# Patient Record
Sex: Female | Born: 1990 | Race: White | Hispanic: No | Marital: Single | Smoking: Never smoker
Health system: Southern US, Community
[De-identification: ages and names within clinical notes are randomized; demographics above are authoritative.]

## PROBLEM LIST (undated history)

## (undated) ENCOUNTER — Inpatient Hospital Stay (HOSPITAL_COMMUNITY): Payer: Self-pay

## (undated) DIAGNOSIS — F329 Major depressive disorder, single episode, unspecified: Secondary | ICD-10-CM

## (undated) DIAGNOSIS — F4310 Post-traumatic stress disorder, unspecified: Secondary | ICD-10-CM

## (undated) DIAGNOSIS — F32A Depression, unspecified: Secondary | ICD-10-CM

## (undated) DIAGNOSIS — F419 Anxiety disorder, unspecified: Secondary | ICD-10-CM

## (undated) DIAGNOSIS — T8859XA Other complications of anesthesia, initial encounter: Secondary | ICD-10-CM

## (undated) DIAGNOSIS — T4145XA Adverse effect of unspecified anesthetic, initial encounter: Secondary | ICD-10-CM

## (undated) DIAGNOSIS — R569 Unspecified convulsions: Secondary | ICD-10-CM

## (undated) HISTORY — DX: Anxiety disorder, unspecified: F41.9

## (undated) HISTORY — DX: Adverse effect of unspecified anesthetic, initial encounter: T41.45XA

## (undated) HISTORY — DX: Other complications of anesthesia, initial encounter: T88.59XA

## (undated) HISTORY — DX: Depression, unspecified: F32.A

## (undated) HISTORY — PX: TONSILLECTOMY: SUR1361

## (undated) HISTORY — DX: Post-traumatic stress disorder, unspecified: F43.10

## (undated) HISTORY — DX: Major depressive disorder, single episode, unspecified: F32.9

---

## 2011-03-23 ENCOUNTER — Emergency Department: Payer: Self-pay | Admitting: *Deleted

## 2017-08-15 NOTE — L&D Delivery Note (Signed)
Delivery Note Pt complete at 430pm. She pushed for and at 5:00 PM a viable female was delivered via Vaginal, Spontaneous (Presentation: LOA with compound right arm presentation;  ).  APGAR: 8, 9; weight  pending.   Placenta status:delivered intact, schultz , .  Cord: 3vc with the following complications: none .  Cord pH: n/a  Anesthesia: Epidural  Episiotomy: None Lacerations: b/l labial abrasions only Suture Repair: n/a Est. Blood Loss (mL): 100  Mom to postpartum.  Baby to Couplet care / Skin to Skin.  Cathrine Muster 06/06/2018, 5:20 PM

## 2017-10-31 LAB — OB RESULTS CONSOLE HIV ANTIBODY (ROUTINE TESTING): HIV: NONREACTIVE

## 2017-10-31 LAB — OB RESULTS CONSOLE RUBELLA ANTIBODY, IGM: RUBELLA: IMMUNE

## 2017-10-31 LAB — OB RESULTS CONSOLE ABO/RH: RH Type: POSITIVE

## 2017-10-31 LAB — OB RESULTS CONSOLE HEPATITIS B SURFACE ANTIGEN: Hepatitis B Surface Ag: NEGATIVE

## 2017-10-31 LAB — OB RESULTS CONSOLE GC/CHLAMYDIA
Chlamydia: NEGATIVE
GC PROBE AMP, GENITAL: NEGATIVE

## 2017-10-31 LAB — OB RESULTS CONSOLE RPR: RPR: NONREACTIVE

## 2017-10-31 LAB — OB RESULTS CONSOLE ANTIBODY SCREEN: ANTIBODY SCREEN: NEGATIVE

## 2017-11-27 ENCOUNTER — Encounter: Payer: Self-pay | Admitting: Emergency Medicine

## 2017-11-27 ENCOUNTER — Inpatient Hospital Stay (HOSPITAL_COMMUNITY)
Admission: AD | Admit: 2017-11-27 | Discharge: 2017-11-27 | Discharge status: Against Medical Advice | Payer: Medicaid Other | Source: Ambulatory Visit | Attending: Obstetrics and Gynecology | Admitting: Obstetrics and Gynecology

## 2017-11-27 ENCOUNTER — Encounter (HOSPITAL_COMMUNITY): Payer: Self-pay | Admitting: *Deleted

## 2017-11-27 ENCOUNTER — Inpatient Hospital Stay (HOSPITAL_COMMUNITY)
Admission: AD | Admit: 2017-11-27 | Discharge: 2017-11-27 | Discharge status: Absent without leave | Payer: Self-pay | Attending: Obstetrics and Gynecology | Admitting: Obstetrics and Gynecology

## 2017-11-27 ENCOUNTER — Emergency Department: Payer: Medicaid Other

## 2017-11-27 DIAGNOSIS — O208 Other hemorrhage in early pregnancy: Secondary | ICD-10-CM | POA: Insufficient documentation

## 2017-11-27 DIAGNOSIS — R102 Pelvic and perineal pain: Secondary | ICD-10-CM | POA: Diagnosis not present

## 2017-11-27 DIAGNOSIS — Z3A13 13 weeks gestation of pregnancy: Secondary | ICD-10-CM | POA: Diagnosis not present

## 2017-11-27 DIAGNOSIS — Z3481 Encounter for supervision of other normal pregnancy, first trimester: Secondary | ICD-10-CM | POA: Insufficient documentation

## 2017-11-27 DIAGNOSIS — Z3A12 12 weeks gestation of pregnancy: Secondary | ICD-10-CM | POA: Diagnosis not present

## 2017-11-27 DIAGNOSIS — O9989 Other specified diseases and conditions complicating pregnancy, childbirth and the puerperium: Secondary | ICD-10-CM | POA: Diagnosis present

## 2017-11-27 DIAGNOSIS — O2341 Unspecified infection of urinary tract in pregnancy, first trimester: Secondary | ICD-10-CM | POA: Diagnosis not present

## 2017-11-27 LAB — URINALYSIS, ROUTINE W REFLEX MICROSCOPIC
BACTERIA UA: NONE SEEN
Bilirubin Urine: NEGATIVE
Glucose, UA: NEGATIVE mg/dL
HGB URINE DIPSTICK: NEGATIVE
Ketones, ur: NEGATIVE mg/dL
NITRITE: NEGATIVE
PROTEIN: NEGATIVE mg/dL
SPECIFIC GRAVITY, URINE: 1.031 — AB (ref 1.005–1.030)
pH: 5 (ref 5.0–8.0)

## 2017-11-27 LAB — URINALYSIS, COMPLETE (UACMP) WITH MICROSCOPIC
Bilirubin Urine: NEGATIVE
GLUCOSE, UA: NEGATIVE mg/dL
HGB URINE DIPSTICK: NEGATIVE
KETONES UR: NEGATIVE mg/dL
NITRITE: NEGATIVE
PH: 6 (ref 5.0–8.0)
Protein, ur: NEGATIVE mg/dL
Specific Gravity, Urine: 1.03 (ref 1.005–1.030)

## 2017-11-27 LAB — CBC WITH DIFFERENTIAL/PLATELET
Basophils Absolute: 0 10*3/uL (ref 0–0.1)
Basophils Relative: 0 %
EOS ABS: 0.1 10*3/uL (ref 0–0.7)
Eosinophils Relative: 1 %
HCT: 36.8 % (ref 35.0–47.0)
HEMOGLOBIN: 13.1 g/dL (ref 12.0–16.0)
Lymphocytes Relative: 26 %
Lymphs Abs: 3.3 10*3/uL (ref 1.0–3.6)
MCH: 32.2 pg (ref 26.0–34.0)
MCHC: 35.5 g/dL (ref 32.0–36.0)
MCV: 90.6 fL (ref 80.0–100.0)
MONO ABS: 0.9 10*3/uL (ref 0.2–0.9)
MONOS PCT: 7 %
NEUTROS ABS: 8.3 10*3/uL — AB (ref 1.4–6.5)
NEUTROS PCT: 66 %
Platelets: 190 10*3/uL (ref 150–440)
RBC: 4.06 MIL/uL (ref 3.80–5.20)
RDW: 12.7 % (ref 11.5–14.5)
WBC: 12.6 10*3/uL — ABNORMAL HIGH (ref 3.6–11.0)

## 2017-11-27 LAB — BASIC METABOLIC PANEL
Anion gap: 6 (ref 5–15)
BUN: 9 mg/dL (ref 6–20)
CHLORIDE: 106 mmol/L (ref 101–111)
CO2: 25 mmol/L (ref 22–32)
CREATININE: 0.42 mg/dL — AB (ref 0.44–1.00)
Calcium: 8.7 mg/dL — ABNORMAL LOW (ref 8.9–10.3)
GFR calc Af Amer: >60 mL/min (ref >60)
GFR calc non Af Amer: >60 mL/min (ref >60)
Glucose, Bld: 85 mg/dL (ref 65–99)
Potassium: 3.6 mmol/L (ref 3.5–5.1)
Sodium: 137 mmol/L (ref 135–145)

## 2017-11-27 LAB — HCG, QUANTITATIVE, PREGNANCY: hCG, Beta Chain, Quant, S: 96619 m[IU]/mL — ABNORMAL HIGH (ref <5)

## 2017-11-27 NOTE — MAU Note (Addendum)
Pt C/O sudden onset of severe lower & upper abdominal cramping, also back pain - since 1500 today.  Denies bleeding.  Was seen by Dr. Mindi Slicker earlier today, everything was fine, pain started later.

## 2017-11-27 NOTE — ED Triage Notes (Addendum)
Pt reports she is [redacted] weeks pregnant and has been experiencing lower back as well as lower abdomen cramping and pain x1 day. Pt denies bleeding but sts she is experiencing increased "dark" discharge. [facility] and was sent [facility] medically cleared.

## 2017-11-28 ENCOUNTER — Emergency Department
Admission: EM | Admit: 2017-11-28 | Discharge: 2017-11-28 | Disposition: A | Discharge status: Home / Self Care | Payer: Medicaid Other | Attending: Emergency Medicine | Admitting: Emergency Medicine

## 2017-11-28 DIAGNOSIS — O468X1 Other antepartum hemorrhage, first trimester: Secondary | ICD-10-CM

## 2017-11-28 DIAGNOSIS — O418X10 Other specified disorders of amniotic fluid and membranes, first trimester, not applicable or unspecified: Secondary | ICD-10-CM

## 2017-11-28 DIAGNOSIS — N390 Urinary tract infection, site not specified: Secondary | ICD-10-CM

## 2017-11-28 DIAGNOSIS — Z3A12 12 weeks gestation of pregnancy: Secondary | ICD-10-CM

## 2017-11-28 HISTORY — DX: Unspecified convulsions: R56.9

## 2017-11-28 LAB — WET PREP, GENITAL
Clue Cells Wet Prep HPF POC: NONE SEEN
SPERM: NONE SEEN
TRICH WET PREP: NONE SEEN
YEAST WET PREP: NONE SEEN

## 2017-11-28 LAB — CHLAMYDIA/NGC RT PCR (ARMC ONLY)
Chlamydia Tr: NOT DETECTED
N GONORRHOEAE: NOT DETECTED

## 2017-11-28 MED ORDER — NITROFURANTOIN MONOHYD MACRO 100 MG PO CAPS: 100.0000 mg | ORAL_CAPSULE | Freq: Two times a day (BID) | ORAL | 0 refills | Status: DC

## 2017-11-28 MED ORDER — NITROFURANTOIN MONOHYD MACRO 100 MG PO CAPS
100.0000 mg | ORAL_CAPSULE | Freq: Once | ORAL | Status: AC
  Administered 2017-11-28: 100 mg via ORAL
  Filled 2017-11-28: qty 1

## 2017-11-28 NOTE — Discharge Instructions (Signed)
1.  Take antibiotic as prescribed (Macrobid 100 mg twice daily for 7 days). 2.  Drink plenty of fluids daily. 3.  Return to the ER for worsening symptoms, persistent vomiting, vaginal bleeding or other concerns.

## 2017-11-28 NOTE — ED Provider Notes (Signed)
[facility] Emergency Department Provider Note   ____________________________________________   First MD Initiated Contact with Patient 11/28/17 [phone]     (approximate)  I have reviewed the triage vital signs and the nursing notes.   HISTORY  Chief Complaint Abdominal Cramping and Back Pain    HPI Misty Lane is a 27 y.o. female G2P1 Ab0 approximately [redacted] weeks pregnant who presents with pelvic cramping, lower back pain and "dark" discharge.  Symptoms since 3 PM.  Yesterday morning she saw her OB for scheduled appointment for first trimester screening ultrasound.  Denies recent fever, chills, chest pain, shortness of breath, diarrhea.  Has had morning sickness for which she is prescribed likely just.  Last sexual intercourse in February.   Past Medical History:  Diagnosis Date  . Seizures (HCC)     There are no active problems to display for this patient.   History reviewed. No pertinent surgical history.  Prior to Admission medications   Medication Sig Start Date End Date Taking? Authorizing Provider  nitrofurantoin, macrocrystal-monohydrate, (MACROBID) 100 MG capsule Take 1 capsule (100 mg total) by mouth 2 (two) times daily. 11/28/17   Irean Hong, MD    Allergies Patient has no known allergies.  History reviewed. No pertinent family history.  Social History Social History   Tobacco Use  . Smoking status: Never Smoker  . Smokeless tobacco: Never Used  Substance Use Topics  . Alcohol use: Not on file  . Drug use: Not on file    Review of Systems  Constitutional: No fever/chills. Eyes: No visual changes. ENT: No sore throat. Cardiovascular: Denies chest pain. Respiratory: Denies shortness of breath. Gastrointestinal: Positive for pelvic pain.  No abdominal pain.  No nausea, no vomiting.  No diarrhea.  No constipation. Genitourinary: Positive for "dark" discharge.  Negative for dysuria. Musculoskeletal: Negative for back  pain. Skin: Negative for rash. Neurological: Negative for headaches, focal weakness or numbness.   ____________________________________________   PHYSICAL EXAM:  VITAL SIGNS: ED Triage Vitals [11/27/17 2056]  Enc Vitals Group     BP 128/76     Pulse Rate 76     Resp 17     Temp 97.6 F (36.4 C)     Temp Source Oral     SpO2 99 %     Weight 197 lb (89.4 kg)     Height      Head Circumference      Peak Flow      Pain Score      Pain Loc      Pain Edu?      Excl. in GC?     Constitutional: Alert and oriented. Well appearing and in no acute distress. Eyes: Conjunctivae are normal. PERRL. EOMI. Head: Atraumatic. Nose: No congestion/rhinnorhea. Mouth/Throat: Mucous membranes are moist.  Oropharynx non-erythematous. Neck: No stridor.   Cardiovascular: Normal rate, regular rhythm. Grossly normal heart sounds.  Good peripheral circulation. Respiratory: Normal respiratory effort.  No retractions. Lungs CTAB. Gastrointestinal: Soft and nontender to light or deep palpation. No distention. No abdominal bruits. No CVA tenderness. Musculoskeletal: No lower extremity tenderness nor edema.  No joint effusions. Neurologic:  Normal speech and language. No gross focal neurologic deficits are appreciated. No gait instability. Skin:  Skin is warm, dry and intact. No rash noted. Psychiatric: Mood and affect are normal. Speech and behavior are normal.  ____________________________________________   LABS (all labs ordered are listed, but only abnormal results are displayed)  Labs Reviewed  WET PREP, GENITAL -  Abnormal; Notable for the following components:      Result Value   WBC, Wet Prep HPF POC FEW (*)    All other components within normal limits  URINALYSIS, COMPLETE (UACMP) WITH MICROSCOPIC - Abnormal; Notable for the following components:   Color, Urine YELLOW (*)    APPearance CLOUDY (*)    Leukocytes, UA LARGE (*)    Bacteria, UA RARE (*)    Squamous Epithelial / LPF TOO  NUMEROUS TO COUNT (*)    All other components within normal limits  HCG, QUANTITATIVE, PREGNANCY - Abnormal; Notable for the following components:   hCG, Beta Chain, Quant, S [id number] (*)    All other components within normal limits  CBC WITH DIFFERENTIAL/PLATELET - Abnormal; Notable for the following components:   WBC 12.6 (*)    Neutro Abs 8.3 (*)    All other components within normal limits  BASIC METABOLIC PANEL - Abnormal; Notable for the following components:   Creatinine, Ser 0.42 (*)    Calcium 8.7 (*)    All other components within normal limits  CHLAMYDIA/NGC RT PCR ([facility] ONLY)  POC URINE PREG, ED   ____________________________________________  EKG  None ____________________________________________  RADIOLOGY  ED MD interpretation:  [redacted]w[redacted]d IUP; small subchorionic hemorrhage  Official radiology report(s): US Ob Comp Less 14 Wks  Result Date: 11/27/2017 CLINICAL DATA:  Acute onset of pelvic pain and cramping. EXAM: OBSTETRIC <14 WK ULTRASOUND TECHNIQUE: Transabdominal ultrasound was performed for evaluation of the gestation as well as the maternal uterus and adnexal regions. COMPARISON:  None. FINDINGS: Intrauterine gestational sac: Single; visualized and normal in shape. Yolk sac:  No Embryo:  Yes Cardiac Activity: Yes Heart Rate: 158 bpm CRL: 6.4 cm   12 w 5 d                  [place] EDC: 06/06/2018 Subchorionic hemorrhage: A small amount of subchorionic hemorrhage is noted at the right uterine fundus. Maternal uterus/adnexae: The uterus is otherwise unremarkable. The right ovary measures 2.8 x 1.5 x 1.2 cm, while the left ovary measures 3.0 x 2.1 x 2.6 cm. No suspicious adnexal masses are seen; there is no evidence for ovarian torsion. No free fluid is seen within the pelvic cul-de-sac. IMPRESSION: 1. Single live intrauterine pregnancy noted, with a crown-rump length of 6.4 cm, corresponding to a gestational age of [redacted] weeks 5 days. This matches the gestational age of [redacted]  weeks 4 days by LMP, reflecting an estimated date of delivery of June 07, 2018. 2. Small amount of subchorionic hemorrhage noted. Electronically Signed   By: Roanna Raider M.D.   On: 11/27/2017 23:26    ____________________________________________   PROCEDURES  Procedure(s) performed:   Pelvic exam: External exam within normal limits without rashes, lesions or vesicles.  Speculum recent exam reveals mild white discharge.  No vaginal bleeding.  Cervical loss is closed.  Bimanual exam unremarkable.  Procedures  Critical Care performed: No  ____________________________________________   INITIAL IMPRESSION / ASSESSMENT AND PLAN / ED COURSE  As part of my medical decision making, I reviewed the following data within the electronic MEDICAL RECORD NUMBER History obtained from family, Nursing notes reviewed and incorporated, Labs reviewed, Old chart reviewed, Radiograph reviewed  and Notes from prior ED visits   27 year old female G2 P1 approximately [redacted] weeks pregnant who presents with pelvic and lower back pain and "dark" discharge. Differential diagnosis includes, but is not limited to, ovarian cyst, ovarian torsion, acute appendicitis, diverticulitis, urinary tract infection/pyelonephritis, endometriosis,  bowel obstruction, colitis, renal colic, gastroenteritis, hernia, fibroids, endometriosis, pregnancy related pain including ectopic pregnancy, etc.  Large leukocytes on urinalysis, subchorionic hemorrhage found on ultrasound.  Clinical Course as of Nov 29 351  Tue Nov 28, 2017  0344 Updated patient on wet prep results.  She recently had STD check by her OB which was unremarkable.  Will discharge home on Macrobid and she will follow-up closely with her OB/GYN.  Strict return precautions given.  Patient verbalizes understanding and agrees with plan of care.   [JS]    Clinical Course User Index [JS] Irean HongSung, Ahmad Vanwey J, MD     ____________________________________________   FINAL CLINICAL  IMPRESSION(S) / ED DIAGNOSES  Final diagnoses:  Lower urinary tract infectious disease  [redacted] weeks gestation of pregnancy  Subchorionic hemorrhage of placenta in first trimester, single or unspecified fetus     ED Discharge Orders        Ordered    nitrofurantoin, macrocrystal-monohydrate, (MACROBID) 100 MG capsule  2 times daily     11/28/17 0348       Note:  This document was prepared using Dragon voice recognition software and may include unintentional dictation errors.    Irean HongSung, Eldar Robitaille J, MD 11/28/17 [phone]

## 2017-11-28 NOTE — ED Notes (Signed)
Patient discharged to home per MD order. Patient in stable condition, and deemed medically cleared by ED provider for discharge. Discharge instructions reviewed with patient/family using "Teach Back"; verbalized understanding of medication education and administration, and information about follow-up care. Denies further concerns. ° °

## 2018-05-31 LAB — OB RESULTS CONSOLE GBS: GBS: NEGATIVE

## 2018-05-31 IMAGING — US US OB COMP LESS 14 WK
1 series · 13 of 28 positions shown · non-contrast
Comparison: None.

CLINICAL DATA: Acute onset of pelvic pain and cramping.

EXAM:
OBSTETRIC <14 WK ULTRASOUND
TECHNIQUE: Transabdominal ultrasound was performed for evaluation of the
gestation as well as the maternal uterus and adnexal regions.

[Series 1: us ob comp less 14 wk · 0.19mm/px · 53 acquisitions, 13 frames shown]
[im 2/53]
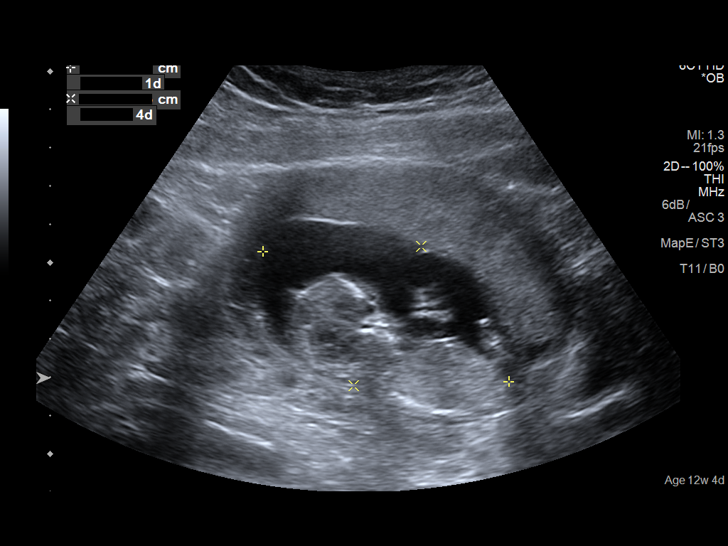
[im 6/53]
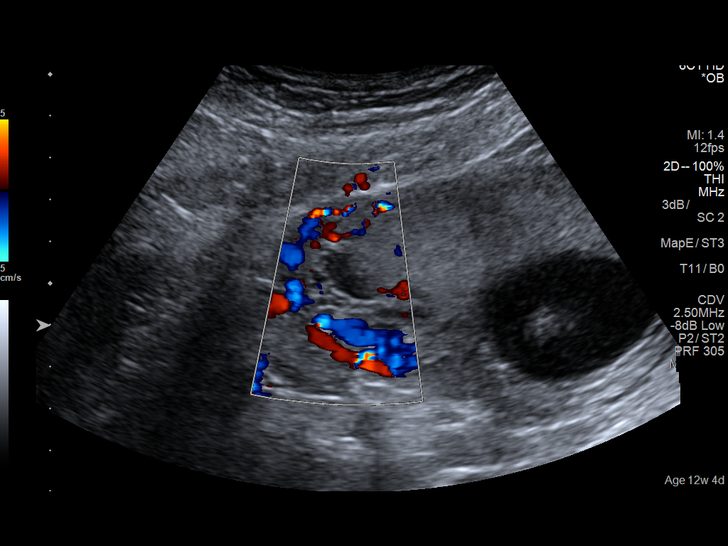
[im 10/53]
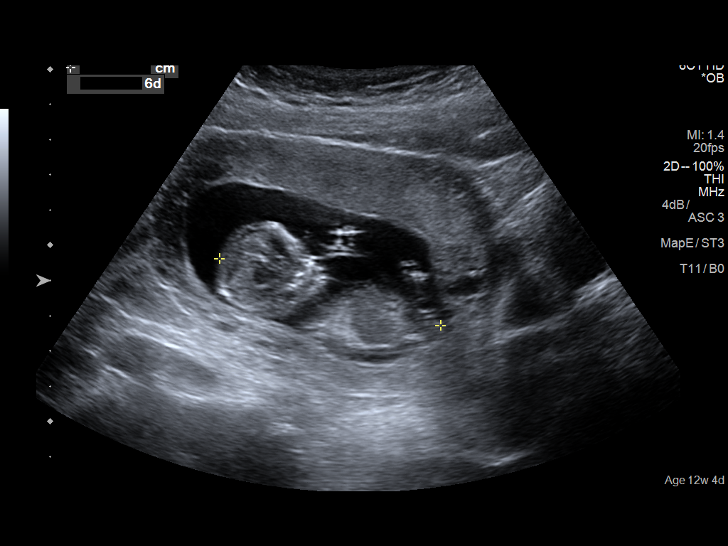
[im 14/53]
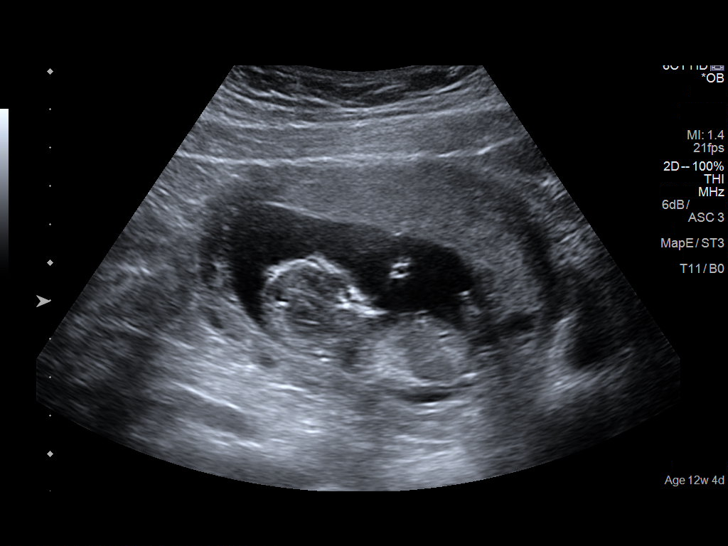
[im 18/53]
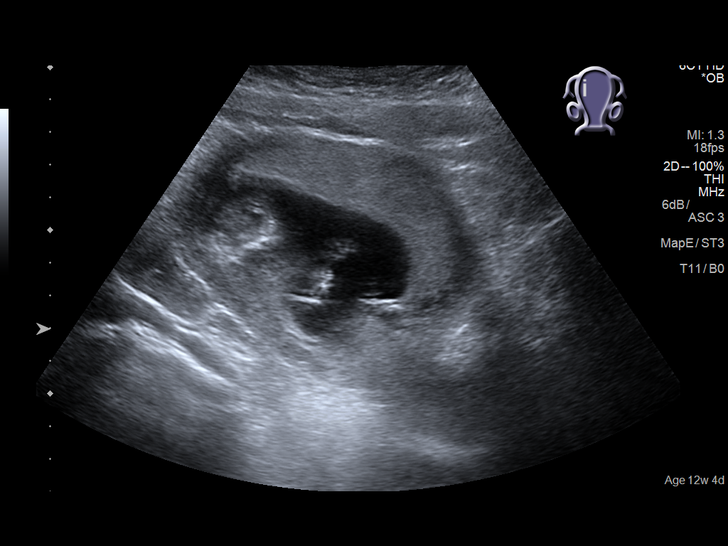
[im 22/53]
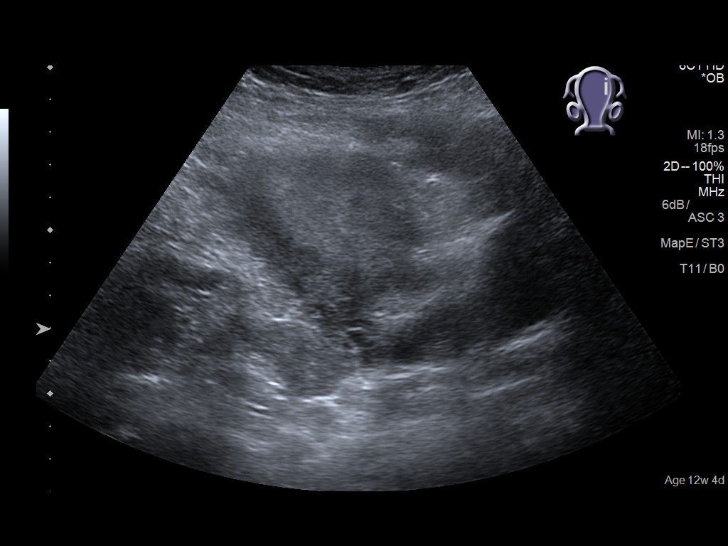
[im 27/53]
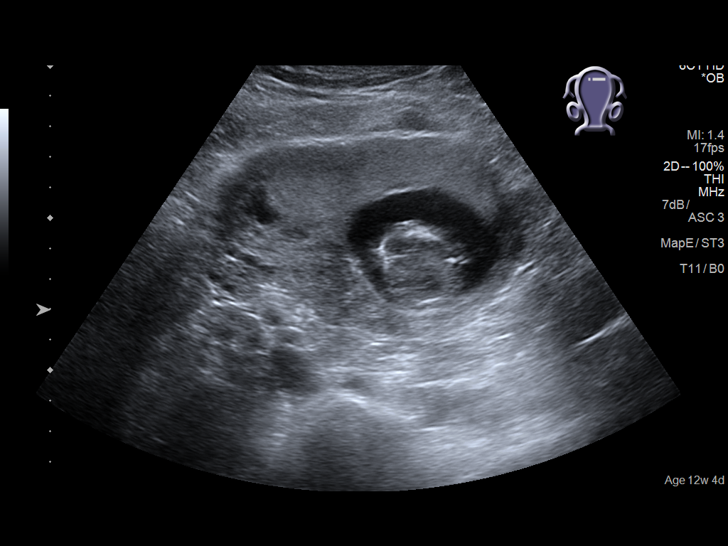
[im 31/53]
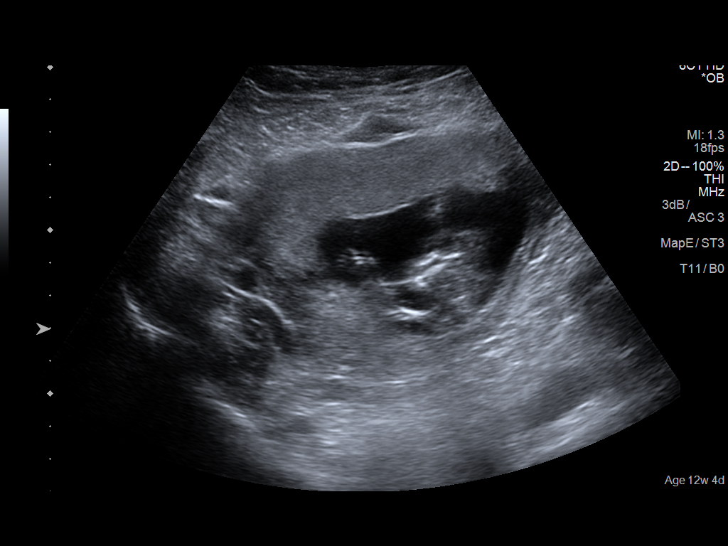
[im 35/53]
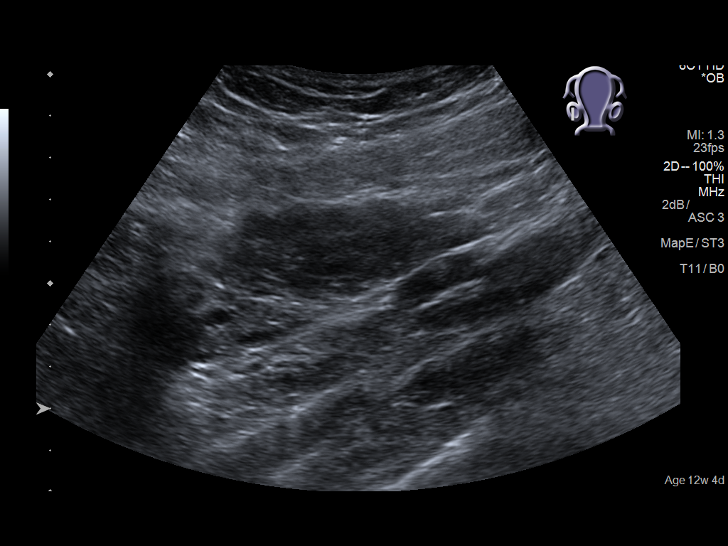
[im 39/53]
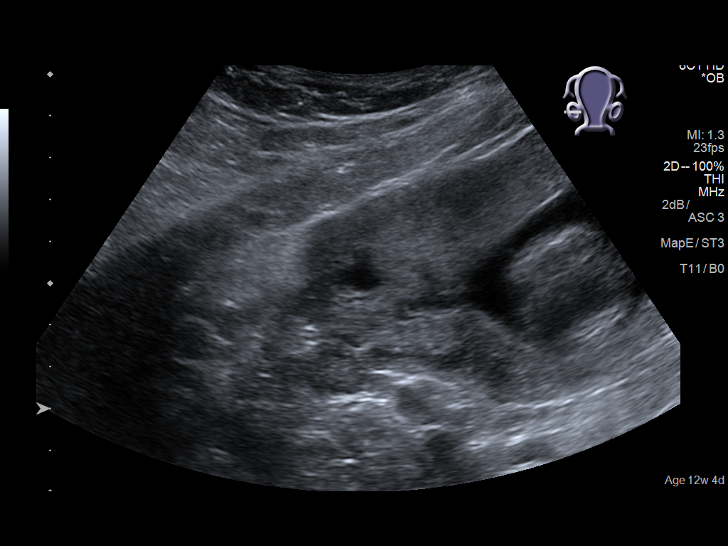
[im 43/53]
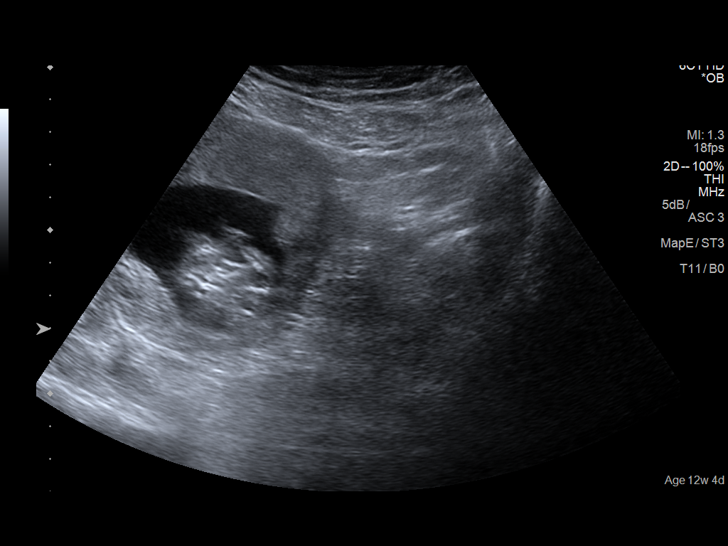
[im 47/53]
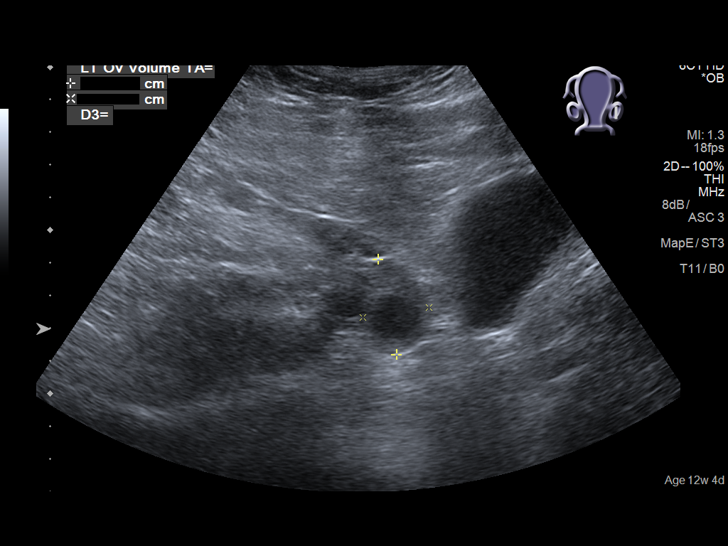
[im 51/53]
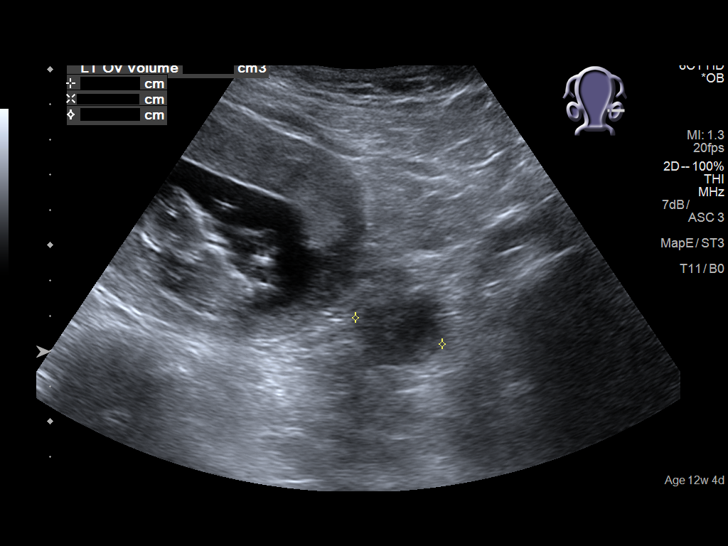

[13 of 28 positions shown; findings below may reference images not displayed]

FINDINGS: Intrauterine gestational sac: Single; visualized and normal in
shape.

Yolk sac:  No

Embryo:  Yes

Cardiac Activity: Yes

Heart Rate: 158 bpm

CRL: 6.4 cm   12 w 5 d                  US EDC: 06/06/2018

Subchorionic hemorrhage: A small amount of subchorionic hemorrhage
is noted at the right uterine fundus.

Maternal uterus/adnexae: The uterus is otherwise unremarkable.

The right ovary measures 2.8 x 1.5 x 1.2 cm, while the left ovary
measures 3.0 x 2.1 x 2.6 cm. No suspicious adnexal masses are seen;
there is no evidence for ovarian torsion.

No free fluid is seen within the pelvic cul-de-sac.
IMPRESSION: 1. Single live intrauterine pregnancy noted, with a crown-rump
length of 6.4 cm, corresponding to a gestational age of 12 weeks 5
days. This matches the gestational age of 12 weeks 4 days by LMP,
reflecting an estimated date of delivery June 07, 2018.
2. Small amount of subchorionic hemorrhage noted.

## 2018-06-01 ENCOUNTER — Encounter (HOSPITAL_COMMUNITY): Payer: Self-pay | Admitting: *Deleted

## 2018-06-01 ENCOUNTER — Telehealth (HOSPITAL_COMMUNITY): Payer: Self-pay | Admitting: *Deleted

## 2018-06-01 NOTE — Telephone Encounter (Signed)
Preadmission screen  

## 2018-06-06 ENCOUNTER — Inpatient Hospital Stay (HOSPITAL_COMMUNITY): Payer: Medicaid Other | Admitting: Anesthesiology

## 2018-06-06 ENCOUNTER — Inpatient Hospital Stay (HOSPITAL_COMMUNITY)
Admission: RE | Admit: 2018-06-06 | Discharge: 2018-06-08 | DRG: 807 | Disposition: A | Discharge status: Home / Self Care | Payer: Medicaid Other | Attending: Obstetrics and Gynecology | Admitting: Obstetrics and Gynecology

## 2018-06-06 ENCOUNTER — Encounter (HOSPITAL_COMMUNITY): Payer: Self-pay

## 2018-06-06 ENCOUNTER — Other Ambulatory Visit: Payer: Self-pay

## 2018-06-06 DIAGNOSIS — O26893 Other specified pregnancy related conditions, third trimester: Secondary | ICD-10-CM | POA: Diagnosis present

## 2018-06-06 DIAGNOSIS — O99344 Other mental disorders complicating childbirth: Secondary | ICD-10-CM | POA: Diagnosis present

## 2018-06-06 DIAGNOSIS — O99354 Diseases of the nervous system complicating childbirth: Secondary | ICD-10-CM | POA: Diagnosis present

## 2018-06-06 DIAGNOSIS — Z3A39 39 weeks gestation of pregnancy: Secondary | ICD-10-CM

## 2018-06-06 DIAGNOSIS — O322XX0 Maternal care for transverse and oblique lie, not applicable or unspecified: Secondary | ICD-10-CM | POA: Diagnosis present

## 2018-06-06 DIAGNOSIS — G40909 Epilepsy, unspecified, not intractable, without status epilepticus: Secondary | ICD-10-CM | POA: Diagnosis present

## 2018-06-06 DIAGNOSIS — F418 Other specified anxiety disorders: Secondary | ICD-10-CM | POA: Diagnosis present

## 2018-06-06 LAB — CBC
HEMATOCRIT: 34.2 % — AB (ref 36.0–46.0)
HEMOGLOBIN: 11.6 g/dL — AB (ref 12.0–15.0)
MCH: 31 pg (ref 26.0–34.0)
MCHC: 33.9 g/dL (ref 30.0–36.0)
MCV: 91.4 fL (ref 80.0–100.0)
Platelets: 174 10*3/uL (ref 150–400)
RBC: 3.74 MIL/uL — ABNORMAL LOW (ref 3.87–5.11)
RDW: 13.7 % (ref 11.5–15.5)
WBC: 12.2 10*3/uL — AB (ref 4.0–10.5)
nRBC: 0 % (ref 0.0–0.2)

## 2018-06-06 LAB — TYPE AND SCREEN
ABO/RH(D): O POS
ANTIBODY SCREEN: NEGATIVE

## 2018-06-06 LAB — RPR: RPR Ser Ql: NONREACTIVE

## 2018-06-06 LAB — ABO/RH: ABO/RH(D): O POS

## 2018-06-06 MED ORDER — TERBUTALINE SULFATE 1 MG/ML IJ SOLN
0.2500 mg | Freq: Once | INTRAMUSCULAR | Status: DC | PRN
  Filled 2018-06-06: qty 1

## 2018-06-06 MED ORDER — SENNOSIDES-DOCUSATE SODIUM 8.6-50 MG PO TABS
2.0000 | ORAL_TABLET | ORAL | Status: DC
  Administered 2018-06-07 – 2018-06-08 (×2): 2 via ORAL
  Filled 2018-06-06 (×2): qty 2

## 2018-06-06 MED ORDER — OXYCODONE-ACETAMINOPHEN 5-325 MG PO TABS: 2.0000 | ORAL_TABLET | ORAL | Status: DC | PRN

## 2018-06-06 MED ORDER — ONDANSETRON HCL 4 MG/2ML IJ SOLN: 4.0000 mg | Freq: Four times a day (QID) | INTRAMUSCULAR | Status: DC | PRN

## 2018-06-06 MED ORDER — LAMOTRIGINE 150 MG PO TABS
300.0000 mg | ORAL_TABLET | Freq: Two times a day (BID) | ORAL | Status: DC
  Administered 2018-06-06 (×2): 300 mg via ORAL
  Filled 2018-06-06 (×3): qty 2

## 2018-06-06 MED ORDER — OXYTOCIN 40 UNITS IN LACTATED RINGERS INFUSION - SIMPLE MED: 2.5000 [IU]/h | INTRAVENOUS | Status: DC

## 2018-06-06 MED ORDER — ACETAMINOPHEN 325 MG PO TABS
650.0000 mg | ORAL_TABLET | ORAL | Status: DC | PRN
  Administered 2018-06-06: 650 mg via ORAL
  Filled 2018-06-06: qty 2

## 2018-06-06 MED ORDER — MISOPROSTOL 25 MCG QUARTER TABLET
25.0000 ug | ORAL_TABLET | ORAL | Status: DC | PRN
  Administered 2018-06-06 (×2): 25 ug via VAGINAL
  Filled 2018-06-06 (×3): qty 1

## 2018-06-06 MED ORDER — OXYTOCIN BOLUS FROM INFUSION
500.0000 mL | Freq: Once | INTRAVENOUS | Status: AC
  Administered 2018-06-06: 500 mL via INTRAVENOUS

## 2018-06-06 MED ORDER — OXYCODONE-ACETAMINOPHEN 5-325 MG PO TABS: 1.0000 | ORAL_TABLET | ORAL | Status: DC | PRN

## 2018-06-06 MED ORDER — PHENYLEPHRINE 40 MCG/ML (10ML) SYRINGE FOR IV PUSH (FOR BLOOD PRESSURE SUPPORT)
80.0000 ug | PREFILLED_SYRINGE | INTRAVENOUS | Status: DC | PRN
  Filled 2018-06-06: qty 5

## 2018-06-06 MED ORDER — PHENYTOIN SODIUM EXTENDED 100 MG PO CAPS
100.0000 mg | ORAL_CAPSULE | Freq: Every day | ORAL | Status: DC
  Administered 2018-06-06: 100 mg via ORAL
  Filled 2018-06-06 (×2): qty 1

## 2018-06-06 MED ORDER — COCONUT OIL OIL: 1.0000 "application " | TOPICAL_OIL | Status: DC | PRN

## 2018-06-06 MED ORDER — EPHEDRINE 5 MG/ML INJ
10.0000 mg | INTRAVENOUS | Status: DC | PRN
  Filled 2018-06-06: qty 2

## 2018-06-06 MED ORDER — OXYCODONE HCL 5 MG PO TABS: 10.0000 mg | ORAL_TABLET | ORAL | Status: DC | PRN

## 2018-06-06 MED ORDER — LIDOCAINE HCL (PF) 1 % IJ SOLN
30.0000 mL | INTRAMUSCULAR | Status: DC | PRN
  Filled 2018-06-06: qty 30

## 2018-06-06 MED ORDER — PHENYLEPHRINE 40 MCG/ML (10ML) SYRINGE FOR IV PUSH (FOR BLOOD PRESSURE SUPPORT)
80.0000 ug | PREFILLED_SYRINGE | INTRAVENOUS | Status: DC | PRN
  Filled 2018-06-06: qty 10
  Filled 2018-06-06: qty 5

## 2018-06-06 MED ORDER — LAMOTRIGINE 150 MG PO TABS
300.0000 mg | ORAL_TABLET | Freq: Two times a day (BID) | ORAL | Status: DC
  Administered 2018-06-07 – 2018-06-08 (×3): 300 mg via ORAL
  Filled 2018-06-06 (×6): qty 2

## 2018-06-06 MED ORDER — DIPHENHYDRAMINE HCL 25 MG PO CAPS: 25.0000 mg | ORAL_CAPSULE | Freq: Four times a day (QID) | ORAL | Status: DC | PRN

## 2018-06-06 MED ORDER — ONDANSETRON HCL 4 MG/2ML IJ SOLN: 4.0000 mg | INTRAMUSCULAR | Status: DC | PRN

## 2018-06-06 MED ORDER — LACTATED RINGERS IV SOLN
500.0000 mL | Freq: Once | INTRAVENOUS | Status: AC
  Administered 2018-06-06: 500 mL via INTRAVENOUS

## 2018-06-06 MED ORDER — PHENYTOIN SODIUM EXTENDED 100 MG PO CAPS
100.0000 mg | ORAL_CAPSULE | Freq: Every day | ORAL | Status: DC
  Administered 2018-06-07 – 2018-06-08 (×2): 100 mg via ORAL
  Filled 2018-06-06 (×3): qty 1

## 2018-06-06 MED ORDER — TETANUS-DIPHTH-ACELL PERTUSSIS 5-2.5-18.5 LF-MCG/0.5 IM SUSP: 0.5000 mL | Freq: Once | INTRAMUSCULAR | Status: DC

## 2018-06-06 MED ORDER — LACTATED RINGERS IV SOLN
INTRAVENOUS | Status: DC
  Administered 2018-06-06 (×3): via INTRAVENOUS

## 2018-06-06 MED ORDER — SOD CITRATE-CITRIC ACID 500-334 MG/5ML PO SOLN
30.0000 mL | ORAL | Status: DC | PRN
  Administered 2018-06-06: 30 mL via ORAL
  Filled 2018-06-06: qty 15

## 2018-06-06 MED ORDER — PHENYTOIN SODIUM EXTENDED 100 MG PO CAPS
200.0000 mg | ORAL_CAPSULE | Freq: Two times a day (BID) | ORAL | Status: DC
  Administered 2018-06-06 (×2): 200 mg via ORAL
  Filled 2018-06-06 (×3): qty 2

## 2018-06-06 MED ORDER — DIBUCAINE 1 % RE OINT: 1.0000 "application " | TOPICAL_OINTMENT | RECTAL | Status: DC | PRN

## 2018-06-06 MED ORDER — LIDOCAINE HCL (PF) 1 % IJ SOLN
INTRAMUSCULAR | Status: DC | PRN
  Administered 2018-06-06 (×2): 7 mL via EPIDURAL

## 2018-06-06 MED ORDER — OXYCODONE HCL 5 MG PO TABS
5.0000 mg | ORAL_TABLET | ORAL | Status: DC | PRN
  Administered 2018-06-06: 5 mg via ORAL
  Filled 2018-06-06: qty 1

## 2018-06-06 MED ORDER — PRENATAL MULTIVITAMIN CH
1.0000 | ORAL_TABLET | Freq: Every day | ORAL | Status: DC
  Administered 2018-06-07 – 2018-06-08 (×2): 1 via ORAL
  Filled 2018-06-06 (×2): qty 1

## 2018-06-06 MED ORDER — LACTATED RINGERS IV SOLN: 500.0000 mL | INTRAVENOUS | Status: DC | PRN

## 2018-06-06 MED ORDER — SERTRALINE HCL 50 MG PO TABS
50.0000 mg | ORAL_TABLET | Freq: Every day | ORAL | Status: DC
  Administered 2018-06-06 – 2018-06-07 (×2): 50 mg via ORAL
  Filled 2018-06-06 (×2): qty 1

## 2018-06-06 MED ORDER — PHENYTOIN SODIUM EXTENDED 100 MG PO CAPS
200.0000 mg | ORAL_CAPSULE | Freq: Two times a day (BID) | ORAL | Status: DC
  Administered 2018-06-07 – 2018-06-08 (×3): 200 mg via ORAL
  Filled 2018-06-06 (×6): qty 2

## 2018-06-06 MED ORDER — BENZOCAINE-MENTHOL 20-0.5 % EX AERO: 1.0000 "application " | INHALATION_SPRAY | CUTANEOUS | Status: DC | PRN

## 2018-06-06 MED ORDER — DIPHENHYDRAMINE HCL 50 MG/ML IJ SOLN
12.5000 mg | INTRAMUSCULAR | Status: DC | PRN
  Administered 2018-06-06 (×2): 12.5 mg via INTRAVENOUS
  Filled 2018-06-06: qty 1

## 2018-06-06 MED ORDER — SIMETHICONE 80 MG PO CHEW: 80.0000 mg | CHEWABLE_TABLET | ORAL | Status: DC | PRN

## 2018-06-06 MED ORDER — ACETAMINOPHEN 325 MG PO TABS
650.0000 mg | ORAL_TABLET | ORAL | Status: DC | PRN
  Administered 2018-06-07 (×2): 650 mg via ORAL
  Filled 2018-06-06 (×2): qty 2

## 2018-06-06 MED ORDER — ZOLPIDEM TARTRATE 5 MG PO TABS: 5.0000 mg | ORAL_TABLET | Freq: Every evening | ORAL | Status: DC | PRN

## 2018-06-06 MED ORDER — LAMOTRIGINE 100 MG PO TABS
100.0000 mg | ORAL_TABLET | Freq: Every day | ORAL | Status: DC
  Administered 2018-06-06: 100 mg via ORAL
  Filled 2018-06-06 (×2): qty 1

## 2018-06-06 MED ORDER — IBUPROFEN 600 MG PO TABS
600.0000 mg | ORAL_TABLET | Freq: Four times a day (QID) | ORAL | Status: DC
  Administered 2018-06-07 – 2018-06-08 (×7): 600 mg via ORAL
  Filled 2018-06-06 (×7): qty 1

## 2018-06-06 MED ORDER — ONDANSETRON HCL 4 MG PO TABS: 4.0000 mg | ORAL_TABLET | ORAL | Status: DC | PRN

## 2018-06-06 MED ORDER — OXYTOCIN 40 UNITS IN LACTATED RINGERS INFUSION - SIMPLE MED
1.0000 m[IU]/min | INTRAVENOUS | Status: DC
  Administered 2018-06-06: 2 m[IU]/min via INTRAVENOUS
  Filled 2018-06-06: qty 1000

## 2018-06-06 MED ORDER — LAMOTRIGINE 100 MG PO TABS
100.0000 mg | ORAL_TABLET | Freq: Every day | ORAL | Status: DC
  Administered 2018-06-07 – 2018-06-08 (×2): 100 mg via ORAL
  Filled 2018-06-06 (×3): qty 1

## 2018-06-06 MED ORDER — SERTRALINE HCL 50 MG PO TABS
50.0000 mg | ORAL_TABLET | Freq: Every day | ORAL | Status: DC
  Filled 2018-06-06: qty 1

## 2018-06-06 MED ORDER — WITCH HAZEL-GLYCERIN EX PADS: 1.0000 "application " | MEDICATED_PAD | CUTANEOUS | Status: DC | PRN

## 2018-06-06 MED ORDER — BUTORPHANOL TARTRATE 1 MG/ML IJ SOLN
1.0000 mg | INTRAMUSCULAR | Status: DC | PRN
  Administered 2018-06-06: 1 mg via INTRAVENOUS
  Filled 2018-06-06: qty 1

## 2018-06-06 MED ORDER — FLEET ENEMA 7-19 GM/118ML RE ENEM: 1.0000 | ENEMA | RECTAL | Status: DC | PRN

## 2018-06-06 MED ORDER — FENTANYL 2.5 MCG/ML BUPIVACAINE 1/10 % EPIDURAL INFUSION (WH - ANES)
14.0000 mL/h | INTRAMUSCULAR | Status: DC | PRN
  Administered 2018-06-06: 14 mL/h via EPIDURAL
  Filled 2018-06-06: qty 100

## 2018-06-06 NOTE — Progress Notes (Signed)
Patient ID: Misty Lane, female   DOB: [birth year], 27 y.o.   MRN: [id number] Pt doing well with no complaints. Comfortable with epidural. +Fms VSS EFM - cat 1, 120 TOCO - ctxs q 2-15mins SVE 4/90/-2  A/P: G2P1001 at 39 6/7wks - progressing well on of pitocin         Recheck prn         Anticipate svd

## 2018-06-06 NOTE — H&P (Signed)
Misty Lane is a 27 y.o. G40P1001 female presenting at 55 6/[redacted]wks gestation for elevtive induction of labor. Pt is dated per 8 week [place].  She has a hisotry of aseizure disorder but controlled on dilation, and lacmictal in pregnancy- no episodes. She also has a history of substance abuse and benzo dependence; last used in 2014. Her mixed depression and anxiety was controlled with counseling and zoloft in pregnancy. Essential panel and first trim screen neg. First child with HIE then CP. She is GBS negative. Some social issues OB History    Gravida  2   Para  1   Term  1   Preterm      AB      Living  1     SAB      TAB      Ectopic      Multiple      Live Births  1          Past Medical History:  Diagnosis Date  . Anxiety   . Complication of anesthesia   . Depression   . PTSD (post-traumatic stress disorder)   . Seizures (HCC)    Past Surgical History:  Procedure Laterality Date  . TONSILLECTOMY     Family History: family history is not on file. Social History:  reports that she has never smoked. She has never used smokeless tobacco. She reports that she drank alcohol. She reports that she does not use drugs.     Maternal Diabetes: No Genetic Screening: Normal Maternal Ultrasounds/Referrals: Normal Fetal Ultrasounds or other Referrals:  None Maternal Substance Abuse:  No Significant Maternal Medications:  Meds include: Other: see hpi Significant Maternal Lab Results:  Lab values include: Group B Strep negative Other Comments:  None  Review of Systems  Constitutional: Negative for chills, fever, malaise/fatigue and weight loss.  Eyes: Negative for blurred vision.  Respiratory: Negative for shortness of breath.   Cardiovascular: Negative for chest pain.  Gastrointestinal: Positive for abdominal pain. Negative for heartburn, nausea and vomiting.  Genitourinary: Negative for dysuria.  Musculoskeletal: Negative for myalgias.  Skin: Negative for itching and rash.   Neurological: Negative for dizziness and headaches.  Endo/Heme/Allergies: Does not bruise/bleed easily.  Psychiatric/Behavioral: Negative for depression, hallucinations, substance abuse and suicidal ideas. The patient is not nervous/anxious.    Maternal Medical History:  Reason for admission: Nausea. Elective induction at term  Contractions: Frequency: irregular.   Perceived severity is mild.    Fetal activity: Perceived fetal activity is normal.   Last perceived fetal movement was within the past hour.    Prenatal complications: no prenatal complications Prenatal Complications - Diabetes: none.    Dilation: 2 Effacement (%): 70 Station: -2, -3 Exam by:: stone rnc Blood pressure 133/81, pulse 84, temperature 98.2 F (36.8 C), temperature source Oral, resp. rate 20, height 5\' 2"  (1.575 m), weight 98.2 kg. Maternal Exam:  Uterine Assessment: Contraction strength is mild.  Contraction frequency is irregular.   Abdomen: Patient reports generalized tenderness.  Estimated fetal weight is AGA.   Fetal presentation: vertex  Introitus: Normal vulva. Vulva is negative for condylomata and lesion.  Normal vagina.  Vagina is negative for condylomata and edema.  Pelvis: adequate for delivery.   Cervix: Cervix evaluated by digital exam.     Fetal Exam Fetal Monitor Review: Baseline rate: 120.  Variability: moderate (6-25 bpm).   Pattern: accelerations present and no decelerations.    Fetal State Assessment: Category I - tracings are normal.  Physical Exam  Constitutional: She is oriented to person, place, and time. She appears well-developed and well-nourished.  Neck: Normal range of motion.  Cardiovascular: Normal rate.  Respiratory: Effort normal.  GI: Soft. There is generalized tenderness.  Genitourinary: Vagina normal and uterus normal. Vulva exhibits no lesion.  Musculoskeletal: Normal range of motion. She exhibits no edema.  Neurological: She is alert and oriented  to person, place, and time.  Skin: Skin is warm.  Psychiatric: She has a normal mood and affect. Her behavior is normal. Judgment and thought content normal.    Prenatal labs: ABO, Rh: --/--/O POS, O POS Performed at [facility], [address] [place], [place] [address]  [phone]) Antibody: NEG (10/23 0305) Rubella: Immune (03/19 0000) RPR: Nonreactive (03/19 0000)  HBsAg: Negative (03/19 0000)  HIV: Non-reactive (03/19 0000)  GBS: Negative (10/17 0000)   Assessment/Plan: 26yo G2P1001 at 41 6/[redacted]wks gestation here for elective iol at term S/P cytotec x 2 AROM now; pitocin for augmentation if indicated S/p stadol x 1 Epidural prn GBS neg Anticipate svd   Cecilia W Banga 06/06/2018, 9:12 AM

## 2018-06-06 NOTE — Anesthesia Procedure Notes (Signed)
Epidural Patient location during procedure: OB Start time: 06/06/2018 9:48 AM End time: 06/06/2018 9:51 AM  Staffing Anesthesiologist: Leilani Able, MD Performed: anesthesiologist   Preanesthetic Checklist Completed: patient identified, site marked, surgical consent, pre-op evaluation, timeout performed, IV checked, risks and benefits discussed and monitors and equipment checked  Epidural Patient position: sitting Prep: site prepped and draped and DuraPrep Patient monitoring: continuous pulse ox and blood pressure Approach: midline Location: L3-L4 Injection technique: LOR air  Needle:  Needle type: Tuohy  Needle gauge: 17 G Needle length: 9 cm and 9 Needle insertion depth: 6 cm Catheter type: closed end flexible Catheter size: 19 Gauge Catheter at skin depth: 11 cm Test dose: negative and Other  Assessment Sensory level: T9 Events: blood not aspirated, injection not painful, no injection resistance, negative IV test and no paresthesia  Additional Notes Reason for block:procedure for pain

## 2018-06-06 NOTE — Progress Notes (Signed)
RN prepared to give this patient her scheduled Lamictal and Dilantin per [facility]. The patient notified this RN that she had already been given these medications at 1800. The medication administration at 1800 was not documented. Per patient request, RN adjusted times in [facility] so that medications would reflect the times she takes them at home. Patient states she takes the following:  Lamictal: 300 mg @ 0600, 100 mg @ 1200, 300 mg @ 1800  Dilantin: 200 mg @ 0600, 100 mg @ 1200, 200 mg @ 1800  Patient educated on medications and advised to discuss any futher changes with physician during rounds. RN will make day shift aware.

## 2018-06-06 NOTE — Anesthesia Preprocedure Evaluation (Signed)
Anesthesia Evaluation  Patient identified by MRN, date of birth, ID band Patient awake    Reviewed: Allergy & Precautions, H&P , NPO status , Patient's Chart, lab work & pertinent test results  Airway Mallampati: II  TM Distance: >3 FB Neck ROM: full    Dental no notable dental hx.    Pulmonary neg pulmonary ROS,    Pulmonary exam normal breath sounds clear to auscultation       Cardiovascular negative cardio ROS   Rhythm:regular Rate:Normal     Neuro/Psych Seizures -, Well Controlled,  Anxiety Depression negative psych ROS   GI/Hepatic negative GI ROS, Neg liver ROS,   Endo/Other  negative endocrine ROS  Renal/GU negative Renal ROS  negative genitourinary   Musculoskeletal   Abdominal   Peds  Hematology negative hematology ROS (+)   Anesthesia Other Findings   Reproductive/Obstetrics (+) Pregnancy                             Anesthesia Physical Anesthesia Plan  ASA: II  Anesthesia Plan: Epidural   Post-op Pain Management:    Induction:   PONV Risk Score and Plan:   Airway Management Planned:   Additional Equipment:   Intra-op Plan:   Post-operative Plan:   Informed Consent: I have reviewed the patients History and Physical, chart, labs and discussed the procedure including the risks, benefits and alternatives for the proposed anesthesia with the patient or authorized representative who has indicated his/her understanding and acceptance.     Plan Discussed with:   Anesthesia Plan Comments:         Anesthesia Quick Evaluation

## 2018-06-06 NOTE — Anesthesia Pain Management Evaluation Note (Signed)
  CRNA Pain Management Visit Note  Patient: Misty Lane, 27 y.o., female  "Hello I am a member of the anesthesia team at [facility]. We have an anesthesia team available at all times to provide care throughout the hospital, including epidural management and anesthesia for C-section. I don't know your plan for the delivery whether it a natural birth, water birth, IV sedation, nitrous supplementation, doula or epidural, but we want to meet your pain goals."   1.Was your pain managed to your expectations on prior hospitalizations?   Yes   2.What is your expectation for pain management during this hospitalization?     Epidural and IV pain meds  3.How can we help you reach that goal? Be available  Record the patient's initial score and the patient's pain goal.   Pain: 6  Pain Goal: 6 [facility] wants you to be able to say your pain was always managed very well.  [facility] 06/06/2018

## 2018-06-07 LAB — CBC
HEMATOCRIT: 33.1 % — AB (ref 36.0–46.0)
HEMOGLOBIN: 11.3 g/dL — AB (ref 12.0–15.0)
MCH: 31.9 pg (ref 26.0–34.0)
MCHC: 34.1 g/dL (ref 30.0–36.0)
MCV: 93.5 fL (ref 80.0–100.0)
NRBC: 0 % (ref 0.0–0.2)
Platelets: 130 10*3/uL — ABNORMAL LOW (ref 150–400)
RBC: 3.54 MIL/uL — AB (ref 3.87–5.11)
RDW: 13.6 % (ref 11.5–15.5)
WBC: 15.1 10*3/uL — ABNORMAL HIGH (ref 4.0–10.5)

## 2018-06-07 NOTE — Progress Notes (Signed)
Post Partum Day 1 Subjective: no complaints, up ad lib and tolerating PO  Objective: Blood pressure 136/60, pulse 72, temperature 97.9 F (36.6 C), temperature source Oral, resp. rate 19, height 5\' 2"  (1.575 m), weight 98.2 kg, SpO2 100 %, unknown if currently breastfeeding.  Physical Exam:  General: alert and cooperative Lochia: appropriate Uterine Fundus: firm   Recent Labs    06/06/18 0122 06/07/18 0513  HGB 11.6* 11.3*  HCT 34.2* 33.1*    Assessment/Plan: Plan for discharge tomorrow  Pt states lives with her mom in [place] and has good support Continues lamictal and dilantin for seizure disorder and has appt 06/12/18 with neuro to check levels and adjust meds Thinking wants tubal sterilization, d/w pt going by office to sign consent if so   LOS: 1 day   Oliver Pila 06/07/2018, 9:24 AM
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
## 2018-06-07 NOTE — Anesthesia Postprocedure Evaluation (Signed)
Anesthesia Post Note  Patient: Research scientist (life sciences)  Procedure(s) Performed: AN AD HOC LABOR EPIDURAL     Patient location during evaluation: Mother Baby Anesthesia Type: Epidural Level of consciousness: awake and alert and oriented Pain management: satisfactory to patient Vital Signs Assessment: post-procedure vital signs reviewed and stable Respiratory status: respiratory function stable Cardiovascular status: stable Postop Assessment: no headache, no backache, epidural receding, patient able to bend at knees, no signs of nausea or vomiting and adequate PO intake Anesthetic complications: no    Last Vitals:  Vitals:   06/07/18 0400 06/07/18 0805  BP: 122/72 136/60  Pulse: 77 72  Resp: 18 19  Temp: 37 C 36.6 C  SpO2: 100%     Last Pain:  Vitals:   06/07/18 0805  TempSrc: Oral  PainSc:    Pain Goal: Patients Stated Pain Goal: 2 (06/06/18 0604)               Karleen Dolphin

## 2018-06-07 NOTE — Progress Notes (Signed)
MOB was referred for history of depression/anxiety. * Referral screened out by Clinical Social Worker because none of the following criteria appear to apply: ~ History of anxiety/depression during this pregnancy, or of post-partum depression following prior delivery. ~ Diagnosis of anxiety and/or depression within last 3 years OR * MOB's symptoms currently being treated with medication and/or therapy. Please contact the Clinical Social Worker if needs arise, by MOB request, or if MOB scores greater than 9/yes to question 10 on Edinburgh Postpartum Depression Screen.  Laquinda Moller, LCSW Clinical Social Worker  [facility]  [phone]  

## 2018-06-08 LAB — BIRTH TISSUE RECOVERY COLLECTION (PLACENTA DONATION)

## 2018-06-08 MED ORDER — SERTRALINE HCL 50 MG PO TABS: 50.0000 mg | ORAL_TABLET | Freq: Every day | ORAL | 3 refills | Status: AC

## 2018-06-08 MED ORDER — IBUPROFEN 600 MG PO TABS: 600.0000 mg | ORAL_TABLET | Freq: Four times a day (QID) | ORAL | 0 refills | Status: AC

## 2018-06-08 NOTE — Discharge Instructions (Signed)
As per discharge pamphlet °

## 2018-06-08 NOTE — Discharge Summary (Signed)
OB Discharge Summary     Patient Name: Misty Lane DOB: August 06, 1991 MRN: [id number]  Date of admission: 06/06/2018 Delivering MD: Pry[facility]choa [facility]   Date of discharge: 06/08/2018  Admitting diagnosis: INDUCTION Intrauterine pregnancy: [redacted]w[redacted]d     Secondary diagnosis:  Active Problems:   Normal labor and delivery   Postpartum care following vaginal delivery  Additional problems: seizure disorder     Discharge diagnosis: Term Pregnancy Delivered and seizure disorder                                                                                                Hospital course:  Induction of Labor With Vaginal Delivery   27 y.o. yo [id number] at [redacted]w[redacted]d was admitted to the hospital 06/06/2018 for induction of labor.  Indication for induction: Favorable cervix at term.  Patient had an uncomplicated labor course as follows: Membrane Rupture Time/Date: 9:34 AM ,06/06/2018   Intrapartum Procedures: Episiotomy: None [1]                                         Lacerations:  None [1];Labial [10]  Patient had delivery of a Viable infant.  Information for the patient's newborn:  Kayly, Kriegel [[id number]]  Delivery Method: Vag-Spont   06/06/2018  Details of delivery can be found in separate delivery note.  Patient had a routine postpartum course. Patient is discharged home 06/08/18.  Physical exam  Vitals:   06/07/18 0805 06/07/18 1340 06/07/18 2202 06/08/18 0530  BP: 136/60 132/82 139/85 128/65  Pulse: 72 71 83 79  Resp: 19 16 18    Temp: 97.9 F (36.6 C) 97.8 F (36.6 C)  (!) 97.4 F (36.3 C)  TempSrc: Oral Oral  Oral  SpO2:      Weight:      Height:       General: alert Lochia: appropriate Uterine Fundus: firm  Labs: Lab Results  Component Value Date   WBC 15.1 (H) 06/07/2018   HGB 11.3 (L) 06/07/2018   HCT 33.1 (L) 06/07/2018   MCV 93.5 06/07/2018   PLT 130 (L) 06/07/2018   CMP Latest Ref Rng & Units 11/27/2017  Glucose 65 - 99 mg/dL 85  BUN 6 - 20 mg/dL 9   Creatinine 1.30 - 8.65 mg/dL 7.84(O)  Sodium 962 - 952 mmol/L 137  Potassium 3.5 - 5.1 mmol/L 3.6  Chloride 101 - 111 mmol/L 106  CO2 22 - 32 mmol/L 25  Calcium 8.9 - 10.3 mg/dL 8.4(X)    Discharge instruction: per After Visit Summary and "Baby and Me Booklet".  After visit meds:  Allergies as of 06/08/2018   No Known Allergies     Medication List    TAKE these medications   ibuprofen 600 MG tablet Commonly known as:  ADVIL,MOTRIN Take 1 tablet (600 mg total) by mouth every 6 (six) hours.   lamoTRIgine 150 MG tablet Commonly known as:  LAMICTAL 300 mg 2 (two) times daily.   lamoTRIgine 100 MG tablet Commonly known as:  LAMICTAL Take 100 mg by mouth daily at 12 noon.   phenytoin 200 MG ER capsule Commonly known as:  DILANTIN Take 200 mg by mouth. Takes at 0600 and 1800   phenytoin 100 MG ER capsule Commonly known as:  DILANTIN Take 100 mg by mouth daily at 12 noon.   sertraline 50 MG tablet Commonly known as:  ZOLOFT Take 50 mg by mouth at bedtime. What changed:  Another medication with the same name was added. Make sure you understand how and when to take each.   sertraline 50 MG tablet Commonly known as:  ZOLOFT Take 1 tablet (50 mg total) by mouth daily. What changed:  You were already taking a medication with the same name, and this prescription was added. Make sure you understand how and when to take each.       Diet: routine diet  Activity: Advance as tolerated. Pelvic rest for 6 weeks.   Outpatient follow up:6 weeks  Newborn Data: Live born female  Birth Weight: 7 lb 4.6 oz (3306 g) APGAR: 8, 9  Newborn Delivery   Birth date/time:  06/06/2018 17:00:00 Delivery type:  Vaginal, Spontaneous     Baby Feeding: Breast Disposition:home with mother   06/08/2018 Zenaida Niece, MD

## 2018-06-08 NOTE — Progress Notes (Signed)
PPD #2 Doing well, no problems Afeb, VSS Fundus firm D/c home 

## 2018-09-19 ENCOUNTER — Encounter (HOSPITAL_COMMUNITY): Payer: Self-pay

## 2018-09-19 ENCOUNTER — Ambulatory Visit (HOSPITAL_COMMUNITY): Admit: 2018-09-19 | Payer: Medicaid Other | Admitting: Obstetrics and Gynecology

## 2018-09-19 SURGERY — SALPINGECTOMY, BILATERAL, LAPAROSCOPIC
Anesthesia: General | Laterality: Bilateral
